# Patient Record
Sex: Female | Born: 1985 | Race: White | Hispanic: No | Marital: Single | Smoking: Never smoker
Health system: Southern US, Community
[De-identification: ages and names within clinical notes are randomized; demographics above are authoritative.]

## PROBLEM LIST (undated history)

## (undated) DIAGNOSIS — C8190 Hodgkin lymphoma, unspecified, unspecified site: Secondary | ICD-10-CM

## (undated) HISTORY — PX: PORTA CATH REMOVAL: CATH118286

## (undated) HISTORY — PX: BONE MARROW BIOPSY: SHX199

## (undated) HISTORY — DX: Hodgkin lymphoma, unspecified, unspecified site: C81.90

## (undated) HISTORY — PX: LYMPH NODE BIOPSY: SHX201

---

## 2016-12-31 ENCOUNTER — Other Ambulatory Visit (HOSPITAL_COMMUNITY)
Admission: RE | Admit: 2016-12-31 | Discharge: 2016-12-31 | Disposition: A | Discharge status: Home / Self Care | Payer: BLUE CROSS/BLUE SHIELD | Source: Ambulatory Visit | Attending: Certified Nurse Midwife | Admitting: Certified Nurse Midwife

## 2016-12-31 ENCOUNTER — Encounter: Payer: Self-pay | Admitting: Certified Nurse Midwife

## 2016-12-31 ENCOUNTER — Ambulatory Visit (INDEPENDENT_AMBULATORY_CARE_PROVIDER_SITE_OTHER): Payer: BLUE CROSS/BLUE SHIELD | Admitting: Certified Nurse Midwife

## 2016-12-31 VITALS — BP 104/60 | HR 68 | Resp 16 | Ht 66.25 in | Wt 173.0 lb

## 2016-12-31 DIAGNOSIS — Z01419 Encounter for gynecological examination (general) (routine) without abnormal findings: Secondary | ICD-10-CM

## 2016-12-31 DIAGNOSIS — Z8571 Personal history of Hodgkin lymphoma: Secondary | ICD-10-CM | POA: Diagnosis not present

## 2016-12-31 DIAGNOSIS — Z124 Encounter for screening for malignant neoplasm of cervix: Secondary | ICD-10-CM

## 2016-12-31 DIAGNOSIS — Z8249 Family history of ischemic heart disease and other diseases of the circulatory system: Secondary | ICD-10-CM | POA: Diagnosis not present

## 2016-12-31 DIAGNOSIS — Z0000 Encounter for general adult medical examination without abnormal findings: Secondary | ICD-10-CM

## 2016-12-31 NOTE — Patient Instructions (Signed)

## 2016-12-31 NOTE — Progress Notes (Signed)
31 y.o. G0P0000 Single  Caucasian Fe here to establish gyn and for annual exam. Periods normal, cramping relieved with Midol. Desires STD screening. Interested in other options for contraception. Mother had pulmonary embolism from OCP use at age 11 and father has Atrial fib, so not sure about OCP use. 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 Living in [place] to attend [employer], but home is in [place]. Busy with college and work. Would health screening also and would like to consider fertility information as she froze her eggs prior to chemotherapy for Hodgkins. No other health issues today.  Patient's last menstrual period was 12/19/2016 (exact date).          Sexually active: Yes.    The current method of family planning is condoms sometimes.    Exercising: No.  exercise Smoker:  no  Health Maintenance: Pap:  4-5 yrs ago History of Abnormal Pap: no MMG:  none Self Breast exams: yes Colonoscopy:  none BMD:   none TDaP:  UTD Shingles: no Pneumonia: no Hep C and HIV: neg per patient Labs: yes   reports that she has never smoked. She has never used smokeless tobacco. She reports that she drinks about 0.6 - 1.2 oz of alcohol per week . She reports that she does not use drugs.  Past Medical History:  Diagnosis Date  . Hodgkin lymphoma [facility])     Past Surgical History:  Procedure Laterality Date  . BONE MARROW BIOPSY    . LYMPH NODE BIOPSY     in neck  . PORTA CATH REMOVAL      No current outpatient prescriptions on file.   No current facility-administered medications for this visit.     Family History  Problem Relation Age of Onset  . Pulmonary embolism Mother   . Atrial fibrillation Father   . Thyroid cancer Maternal Aunt   . Lung cancer Paternal Uncle   . Liver cancer Paternal Uncle     ROS:  Pertinent items are noted in HPI.  Otherwise, a comprehensive ROS was negative.  Exam:   BP 104/60   Pulse 68   Resp 16   Ht 5' 6.25" (1.683 m)   Wt 173  lb (78.5 kg)   LMP 12/19/2016 (Exact Date)   BMI 27.71 kg/m  Height: 5' 6.25" (168.3 cm) Ht Readings from Last 3 Encounters:  12/31/16 5' 6.25" (1.683 m)    General appearance: alert, cooperative and appears stated age Head: Normocephalic, without obvious abnormality, atraumatic Neck: no adenopathy, supple, symmetrical, trachea midline and thyroid normal to inspection and palpation Lungs: clear to auscultation bilaterally Breasts: normal appearance, no masses or tenderness, No nipple retraction or dimpling, No nipple discharge or bleeding, No axillary or supraclavicular adenopathy, Taught monthly breast self examination Heart: regular rate and rhythm Abdomen: soft, non-tender; no masses,  no organomegaly Extremities: extremities normal, atraumatic, no cyanosis or edema Skin: Skin color, texture, turgor normal. No rashes or lesions Lymph nodes: Cervical, supraclavicular, and axillary nodes normal. No abnormal inguinal nodes palpated Neurologic: Grossly normal   Pelvic: External genitalia:  no lesions              Urethra:  normal appearing urethra with no masses, tenderness or lesions              Bartholin's and Skene's: normal                 Vagina: normal appearing vagina with normal color and discharge,  no lesions              Cervix: no bleeding following Pap, no cervical motion tenderness, no lesions and nulliparous appearance              Pap taken: Yes.   Bimanual Exam:  Uterus:  normal size, contour, position, consistency, mobility, non-tender and anteverted              Adnexa: normal adnexa and no mass, fullness, tenderness               Rectovaginal: Confirms               Anus:  normal sphincter tone, no lesions  Chaperone present: yes  A:  Well Woman with normal exam  Contraception condoms not always consistent  Contraception options desired  History of Hodgkins Lymphoma yearly follow up now  Fertility status information lab screening may desire  Screening  labs  P:   Reviewed health and wellness pertinent to exam  Discussed due to family history of PE with OCP increases risk with OCP use. Patient feels she had Factor V Lieden screening with Hodgkins and was negative. Given information regarding IUD and Nexplanon use and insurance information to check on benefits if she desires. Questions addressed.  Continue follow up with MD regarding her follow up  Patient will advise if she would like to go forward.  Labs: HIV,RPR, Hep C, Lipid panel, TSH, Vitamin D, vaginal screening  Pap smear: yes   counseled on breast self exam, mammography screening, STD prevention, HIV risk factors and prevention, family planning choices, adequate intake of calcium and vitamin D, diet and exercise  return annually or prn  An After Visit Summary was printed and given to the patient.

## 2017-01-01 ENCOUNTER — Encounter: Payer: Self-pay | Admitting: Certified Nurse Midwife

## 2017-01-01 LAB — LIPID PANEL
CHOL/HDL RATIO: 2.9 ratio (ref 0.0–4.4)
CHOLESTEROL TOTAL: 153 mg/dL (ref 100–199)
HDL: 52 mg/dL (ref >39)
LDL Calculated: 61 mg/dL (ref 0–99)
Triglycerides: 202 mg/dL — ABNORMAL HIGH (ref 0–149)
VLDL Cholesterol Cal: 40 mg/dL (ref 5–40)

## 2017-01-01 LAB — RPR: RPR Ser Ql: NONREACTIVE

## 2017-01-01 LAB — HEPATITIS C ANTIBODY: Hep C Virus Ab: <0.1 s/co ratio (ref 0.0–0.9)

## 2017-01-01 LAB — VITAMIN D 25 HYDROXY (VIT D DEFICIENCY, FRACTURES): VIT D 25 HYDROXY: 30.5 ng/mL (ref 30.0–100.0)

## 2017-01-01 LAB — HIV ANTIBODY (ROUTINE TESTING W REFLEX): HIV Screen 4th Generation wRfx: NONREACTIVE

## 2017-01-04 LAB — CYTOLOGY - PAP
BACTERIAL VAGINITIS: NEGATIVE
CANDIDA VAGINITIS: NEGATIVE
CHLAMYDIA, DNA PROBE: NEGATIVE
Diagnosis: NEGATIVE
HPV (WINDOPATH): NOT DETECTED
NEISSERIA GONORRHEA: NEGATIVE
Trichomonas: NEGATIVE

## 2017-06-16 ENCOUNTER — Emergency Department (HOSPITAL_COMMUNITY)
Admission: EM | Admit: 2017-06-16 | Discharge: 2017-06-16 | Disposition: A | Discharge status: Home / Self Care | Payer: BLUE CROSS/BLUE SHIELD | Attending: Emergency Medicine | Admitting: Emergency Medicine

## 2017-06-16 ENCOUNTER — Other Ambulatory Visit: Payer: Self-pay

## 2017-06-16 ENCOUNTER — Encounter (HOSPITAL_COMMUNITY): Payer: Self-pay | Admitting: *Deleted

## 2017-06-16 DIAGNOSIS — Y999 Unspecified external cause status: Secondary | ICD-10-CM | POA: Diagnosis not present

## 2017-06-16 DIAGNOSIS — X509XXA Other and unspecified overexertion or strenuous movements or postures, initial encounter: Secondary | ICD-10-CM | POA: Insufficient documentation

## 2017-06-16 DIAGNOSIS — S86819A Strain of other muscle(s) and tendon(s) at lower leg level, unspecified leg, initial encounter: Secondary | ICD-10-CM

## 2017-06-16 DIAGNOSIS — S8992XA Unspecified injury of left lower leg, initial encounter: Secondary | ICD-10-CM | POA: Diagnosis present

## 2017-06-16 DIAGNOSIS — Y92038 Other place in apartment as the place of occurrence of the external cause: Secondary | ICD-10-CM | POA: Insufficient documentation

## 2017-06-16 DIAGNOSIS — C8190 Hodgkin lymphoma, unspecified, unspecified site: Secondary | ICD-10-CM | POA: Diagnosis not present

## 2017-06-16 DIAGNOSIS — Y93K1 Activity, walking an animal: Secondary | ICD-10-CM | POA: Insufficient documentation

## 2017-06-16 DIAGNOSIS — S86812A Strain of other muscle(s) and tendon(s) at lower leg level, left leg, initial encounter: Secondary | ICD-10-CM | POA: Insufficient documentation

## 2017-06-16 NOTE — Discharge Instructions (Signed)
Rest - please stay off left leg as much as possible for the next several days Ice - ice for 20 minutes at a time, several times a day Use knee immobilizer for the next several days while using crutches Elevate - elevate leg above level of heart Ibuprofen or Naproxen- take with food. Take as directed. Follow up with orthopedics if symptoms are not improving in the next several weeks

## 2017-06-16 NOTE — ED Triage Notes (Signed)
Pt here stating she stepped down from stair and she heard her calf pop.  Since then she has been unable to bear weight d/t pain (states feels like she has a "permanent charlie horse").  Able to move foot and knee.

## 2017-06-16 NOTE — ED Provider Notes (Addendum)
[facility]GENCY DEPARTMENT Provider Note   CSN: [id number] Arrival date & time: 06/16/17  1629     History   Chief Complaint Chief Complaint  Patient presents with  . Leg Pain    HPI Alisha Schroeder is a 32 y.o. female who presents with left calf pain.  She states that she was walking her dog and was going down her apartment steps and when she stepped down she felt a pop in her calf and immediate onset of pain.  The incident occurred about 2 hours ago. The pain is over the medial side of the calf.  She has been unable to bear weight due to pain.  She denies knee or ankle pain.  She denies numbness or tingling.  She has never had anything like this before.  She has had issues with charley horses in the past but this is gotten better.  HPI  Past Medical History:  Diagnosis Date  . Hodgkin lymphoma ([place])     There are no active problems to display for this patient.   Past Surgical History:  Procedure Laterality Date  . BONE MARROW BIOPSY    . LYMPH NODE BIOPSY     in neck  . PORTA CATH REMOVAL      OB History    Gravida Para Term Preterm AB Living   0 0 0 0 0 0   SAB TAB Ectopic Multiple Live Births   0 0 0 0 0       Home Medications    Prior to Admission medications   Not on File    Family History Family History  Problem Relation Age of Onset  . Pulmonary embolism Mother   . Atrial fibrillation Father   . Thyroid cancer Maternal Aunt   . Lung cancer Paternal Uncle   . Liver cancer Paternal Uncle     Social History Social History   Tobacco Use  . Smoking status: Never Smoker  . Smokeless tobacco: Never Used  Substance Use Topics  . Alcohol use: Yes    Alcohol/week: 0.6 - 1.2 oz    Types: 1 - 2 Standard drinks or equivalent per week  . Drug use: No     Allergies   Patient has no known allergies.   Review of Systems Review of Systems  Musculoskeletal: Positive for gait problem and myalgias. Negative for arthralgias.    Neurological: Negative for weakness and numbness.     Physical Exam Updated Vital Signs BP 131/82 (BP Location: Right Arm)   Pulse (!) 106   Temp 98.4 F (36.9 C) (Oral)   Resp 16   Ht 5' 6"  (1.676 m)   Wt 78 kg (172 lb)   LMP 06/04/2017   SpO2 100%   BMI 27.76 kg/m   Physical Exam  Constitutional: She is oriented to person, place, and time. She appears well-developed and well-nourished. No distress.  HENT:  Head: Normocephalic and atraumatic.  Eyes: Conjunctivae are normal. Pupils are equal, round, and reactive to light. Right eye exhibits no discharge. Left eye exhibits no discharge. No scleral icterus.  Neck: Normal range of motion.  Cardiovascular: Normal rate.  Pulmonary/Chest: Effort normal. No respiratory distress.  Abdominal: She exhibits no distension.  Musculoskeletal:  Left knee: No obvious swelling, deformity, or warmth. No tenderness. FROM.  Left calf: No obvious swelling, deformity, warmth, or skin changes. Tenderness over medial aspect of calf.   Left ankle: No obvious swelling, deformity, or warmth. Achilles is intact. N/V intact.  Neurological: She is alert and oriented to person, place, and time.  Skin: Skin is warm and dry.  Psychiatric: She has a normal mood and affect. Her behavior is normal.  Nursing note and vitals reviewed.    ED Treatments / Results  Labs (all labs ordered are listed, but only abnormal results are displayed) Labs Reviewed - No data to display  EKG  EKG Interpretation None       Radiology No results found.  Procedures Procedures (including critical care time)  Medications Ordered in ED Medications - No data to display   Initial Impression / Assessment and Plan / ED Course  I have reviewed the triage vital signs and the nursing notes.  Pertinent labs & imaging results that were available during my care of the patient were reviewed by me and considered in my medical decision making (see chart for  details).  32 year old female with symptoms consistent with a calf muscle strain.  Exam is remarkable for tenderness over the medial calf without knee or ankle tenderness.  Will defer imaging since injury is clearly soft tissue related. She is unable to bear weight due to pain.  We will give her a knee immobilizer and crutches.  Rice protocol discussed.  Advised taking NSAIDs and Tylenol for pain.  Recommended orthopedic follow-up if symptoms are not improving in several weeks.  Final Clinical Impressions(s) / ED Diagnoses   Final diagnoses:  Strain of calf muscle, initial encounter    ED Discharge Orders    None       Recardo Evangelist, PA-C 06/16/17 1809    Recardo Evangelist, PA-C 06/16/17 1810    Sherwood Gambler, MD 06/17/17 [phone]

## 2018-01-07 ENCOUNTER — Ambulatory Visit: Payer: BLUE CROSS/BLUE SHIELD | Admitting: Certified Nurse Midwife

## 2018-06-01 ENCOUNTER — Ambulatory Visit: Payer: Self-pay | Admitting: Certified Nurse Midwife

## 2018-06-08 ENCOUNTER — Other Ambulatory Visit: Payer: Self-pay

## 2018-06-08 ENCOUNTER — Other Ambulatory Visit (HOSPITAL_COMMUNITY)
Admission: RE | Admit: 2018-06-08 | Discharge: 2018-06-08 | Disposition: A | Discharge status: Home / Self Care | Payer: 59 | Source: Ambulatory Visit | Attending: Certified Nurse Midwife | Admitting: Certified Nurse Midwife

## 2018-06-08 ENCOUNTER — Ambulatory Visit (INDEPENDENT_AMBULATORY_CARE_PROVIDER_SITE_OTHER): Payer: 59 | Admitting: Certified Nurse Midwife

## 2018-06-08 ENCOUNTER — Encounter: Payer: Self-pay | Admitting: Certified Nurse Midwife

## 2018-06-08 VITALS — BP 120/80 | HR 76 | Resp 16 | Ht 66.75 in | Wt 176.0 lb

## 2018-06-08 DIAGNOSIS — Z113 Encounter for screening for infections with a predominantly sexual mode of transmission: Secondary | ICD-10-CM | POA: Diagnosis not present

## 2018-06-08 DIAGNOSIS — N938 Other specified abnormal uterine and vaginal bleeding: Secondary | ICD-10-CM | POA: Diagnosis not present

## 2018-06-08 DIAGNOSIS — Z124 Encounter for screening for malignant neoplasm of cervix: Secondary | ICD-10-CM | POA: Insufficient documentation

## 2018-06-08 DIAGNOSIS — Z8571 Personal history of Hodgkin lymphoma: Secondary | ICD-10-CM

## 2018-06-08 DIAGNOSIS — Z01419 Encounter for gynecological examination (general) (routine) without abnormal findings: Secondary | ICD-10-CM

## 2018-06-08 NOTE — Patient Instructions (Signed)
EXERCISE AND DIET:  We recommended that you start or continue a regular exercise program for good health. Regular exercise means any activity that makes your heart beat faster and makes you sweat.  We recommend exercising at least 30 minutes per day at least 3 days a week, preferably 4 or 5.  We also recommend a diet low in fat and sugar.  Inactivity, poor dietary choices and obesity can cause diabetes, heart attack, stroke, and kidney damage, among others.    ALCOHOL AND SMOKING:  Women should limit their alcohol intake to no more than 7 drinks/beers/glasses of wine (combined, not each!) per week. Moderation of alcohol intake to this level decreases your risk of breast cancer and liver damage. And of course, no recreational drugs are part of a healthy lifestyle.  And absolutely no smoking or even second hand smoke. Most people know smoking can cause heart and lung diseases, but did you know it also contributes to weakening of your bones? Aging of your skin?  Yellowing of your teeth and nails?  CALCIUM AND VITAMIN D:  Adequate intake of calcium and Vitamin D are recommended.  The recommendations for exact amounts of these supplements seem to change often, but generally speaking 600 mg of calcium (either carbonate or citrate) and 800 units of Vitamin D per day seems prudent. Certain women may benefit from higher intake of Vitamin D.  If you are among these women, your doctor will have told you during your visit.    PAP SMEARS:  Pap smears, to check for cervical cancer or precancers,  have traditionally been done yearly, although recent scientific advances have shown that most women can have pap smears less often.  However, every woman still should have a physical exam from her gynecologist every year. It will include a breast check, inspection of the vulva and vagina to check for abnormal growths or skin changes, a visual exam of the cervix, and then an exam to evaluate the size and shape of the uterus and  ovaries.  And after 33 years of age, a rectal exam is indicated to check for rectal cancers. We will also provide age appropriate advice regarding health maintenance, like when you should have certain vaccines, screening for sexually transmitted diseases, bone density testing, colonoscopy, mammograms, etc.   MAMMOGRAMS:  All women over 40 years old should have a yearly mammogram. Many facilities now offer a "3D" mammogram, which may cost around $50 extra out of pocket. If possible,  we recommend you accept the option to have the 3D mammogram performed.  It both reduces the number of women who will be called back for extra views which then turn out to be normal, and it is better than the routine mammogram at detecting truly abnormal areas.    COLONOSCOPY:  Colonoscopy to screen for colon cancer is recommended for all women at age 50.  We know, you hate the idea of the prep.  We agree, BUT, having colon cancer and not knowing it is worse!!  Colon cancer so often starts as a polyp that can be seen and removed at colonscopy, which can quite literally save your life!  And if your first colonoscopy is normal and you have no family history of colon cancer, most women don't have to have it again for 10 years.  Once every ten years, you can do something that may end up saving your life, right?  We will be happy to help you get it scheduled when you are ready.    Be sure to check your insurance coverage so you understand how much it will cost.  It may be covered as a preventative service at no cost, but you should check your particular policy.      Abnormal Uterine Bleeding Abnormal uterine bleeding means bleeding more than usual from your uterus. It can include:  Bleeding between periods.  Bleeding after sex.  Bleeding that is heavier than normal.  Periods that last longer than usual.  Bleeding after you have stopped having your period (menopause). There are many problems that may cause this. You should see  a doctor for any kind of bleeding that is not normal. Treatment depends on the cause of the bleeding. Follow these instructions at home:  Watch your condition for any changes.  Do not use tampons, douche, or have sex, if your doctor tells you not to.  Change your pads often.  Get regular well-woman exams. Make sure they include a pelvic exam and cervical cancer screening.  Keep all follow-up visits as told by your doctor. This is important. Contact a doctor if:  The bleeding lasts more than one week.  You feel dizzy at times.  You feel like you are going to throw up (nauseous).  You throw up. Get help right away if:  You pass out.  You have to change pads every hour.  You have belly (abdominal) pain.  You have a fever.  You get sweaty.  You get weak.  You passing large blood clots from your vagina. Summary  Abnormal uterine bleeding means bleeding more than usual from your uterus.  There are many problems that may cause this. You should see a doctor for any kind of bleeding that is not normal.  Treatment depends on the cause of the bleeding. This information is not intended to replace advice given to you by your health care provider. Make sure you discuss any questions you have with your health care provider. Document Released: 02/01/2009 Document Revised: 03/31/2016 Document Reviewed: 03/31/2016 Elsevier Interactive Patient Education  2019 [employer].

## 2018-06-08 NOTE — Progress Notes (Signed)
33 y.o. G0P0000 Single  Caucasian Fe here for annual exam. Periods normal up to this month with period two weeks ago approximate which had some clotting and now bleeding again now for the past 3 days with normal bleeding. She has had normal periods except when she had chemotherapy with Hodgkin lymphoma. Continues follow up once year with labs and exam,  all normal. Contraception condoms sometimes. No partner change, but STD screening  Desired due to period change. 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 Sees Urgent care if needed. No other health concerns today. Leaving [place] vacation soon!  Patient's last menstrual period was 06/06/2018.          Sexually active: Yes.    The current method of family planning is none.    Exercising: No.  The patient does not participate in regular exercise at present. Smoker:  no  Review of Systems  Constitutional: Negative.   HENT: Negative.   Eyes: Negative.   Respiratory: Negative.   Cardiovascular: Negative.   Gastrointestinal: Negative.   Genitourinary:       Menstrual cycle changes Unscheduled bleeding or spotting   Musculoskeletal: Negative.   Skin: Negative.   Neurological: Negative.   Endo/Heme/Allergies: Negative.   Psychiatric/Behavioral: Negative.     Health Maintenance: Pap:  12-31-16 neg HPV HR neg History of Abnormal Pap: no MMG:  none Self Breast exams: yes Colonoscopy:  none BMD:   none TDaP:  UTD Shingles: no Pneumonia: no Hep C and HIV: both neg 2018 Labs: discuss today if needed   reports that she has never smoked. She has never used smokeless tobacco. She reports current alcohol use of about 1.0 - 2.0 standard drinks of alcohol per week. She reports that she does not use drugs.  Past Medical History:  Diagnosis Date  . Hodgkin lymphoma [facility])     Past Surgical History:  Procedure Laterality Date  . BONE MARROW BIOPSY    . LYMPH NODE BIOPSY     in neck  . PORTA CATH REMOVAL      No current  outpatient medications on file.   No current facility-administered medications for this visit.     Family History  Problem Relation Age of Onset  . Pulmonary embolism Mother   . Atrial fibrillation Father   . Thyroid cancer Maternal Aunt   . Lung cancer Paternal Uncle   . Liver cancer Paternal Uncle     ROS:  Pertinent items are noted in HPI.  Otherwise, a comprehensive ROS was negative.  Exam:   BP 120/80 (BP Location: Right Arm, Patient Position: Sitting, Cuff Size: Normal)   Pulse 76   Resp 16   Ht 5' 6.75" (1.695 m)   Wt 176 lb (79.8 kg)   LMP 06/06/2018   BMI 27.77 kg/m  Height: 5' 6.75" (169.5 cm) Ht Readings from Last 3 Encounters:  06/08/18 5' 6.75" (1.695 m)  06/16/17 5' 6"  (1.676 m)  12/31/16 5' 6.25" (1.683 m)    POCT UPT negative General appearance: alert, cooperative and appears stated age Head: Normocephalic, without obvious abnormality, atraumatic Neck: no adenopathy, supple, symmetrical, trachea midline and thyroid normal to inspection and palpation Lungs: clear to auscultation bilaterally Breasts: normal appearance, no masses or tenderness, No nipple retraction or dimpling, No nipple discharge or bleeding, No axillary or supraclavicular adenopathy Heart: regular rate and rhythm Abdomen: soft, non-tender; no masses,  no organomegaly Extremities: extremities normal, atraumatic, no cyanosis or edema Skin: Skin color, texture, turgor normal.  No rashes or lesions Lymph nodes: Cervical, supraclavicular, and axillary nodes normal. No abnormal inguinal nodes palpated Neurologic: Grossly normal   Pelvic: External genitalia:  no lesions              Urethra:  normal appearing urethra with no masses, tenderness or lesions              Bartholin's and Skene's: normal                 Vagina: normal appearing vagina with normal color and discharge, no lesions              Cervix: no cervical motion tenderness, no lesions, nulliparous appearance and scant blood  noted from cervix              Pap taken: Yes.   Bimanual Exam:  Uterus:  normal size, contour, position, consistency, mobility, non-tender and anteverted              Adnexa: normal adnexa and no mass, fullness, tenderness               Rectovaginal: Confirms               Anus:  normal sphincter tone, no lesions  Chaperone present: yes  A:  Well Woman with normal exam  Contraception condoms sometimes  Menstrual cycle change in past 2 months  History of Hodgkin Lymphoma still in remission with MD follow up  STD screening  P:   Reviewed health and wellness pertinent to exam  Discussed importance of contraception if not trying for pregnancy, for STD prevention. Patient aware. Declines contraception information.  Discussed keeping menstrual calendar and notifying if period is abnormal in the next month. Discussed thyroid, weight change, stress or medication can alter menstrual cycle.  Lab: TSH, STD panel, Gc/chlamydia, Affirm, Hep C  Pap smear: yes   counseled on breast self exam, STD prevention, HIV risk factors and prevention, feminine hygiene, family planning choices, adequate intake of calcium and vitamin D, diet and exercise  return annually or prn  An After Visit Summary was printed and given to the patient.

## 2018-06-09 ENCOUNTER — Other Ambulatory Visit: Payer: Self-pay

## 2018-06-09 LAB — VAGINITIS/VAGINOSIS, DNA PROBE
CANDIDA SPECIES: NEGATIVE
Gardnerella vaginalis: POSITIVE — AB
TRICHOMONAS VAG: NEGATIVE

## 2018-06-09 LAB — CYTOLOGY - PAP
Diagnosis: NEGATIVE
HPV: NOT DETECTED

## 2018-06-09 LAB — HEP, RPR, HIV PANEL
HIV SCREEN 4TH GENERATION: NONREACTIVE
Hepatitis B Surface Ag: NEGATIVE
RPR Ser Ql: NONREACTIVE

## 2018-06-09 LAB — TSH: TSH: 3.82 u[IU]/mL (ref 0.450–4.500)

## 2018-06-09 LAB — HEPATITIS C ANTIBODY

## 2018-06-09 MED ORDER — TINIDAZOLE 500 MG PO TABS: 500.0000 mg | ORAL_TABLET | Freq: Two times a day (BID) | ORAL | 0 refills | Status: DC

## 2018-06-09 NOTE — Telephone Encounter (Signed)
-----   Message from Regina Eck, CNM sent at 06/09/2018 10:26 AM EST ----- Notify patient  Her vaginal screening was positive for BV only, yeast and trichomonas negative Will need Rx Tindamax 500 mg bid x 5 days  Alcohol precautions, Needs to have no concern for pregnancy, no contraception will need to be on period TSH is normal, All other labs pending

## 2018-06-09 NOTE — Telephone Encounter (Signed)
Patient notified of result & rx sent to pharmacy. Patient aware to wait until menstrual cycle to start medication.

## 2018-06-09 NOTE — Telephone Encounter (Signed)
Patient returning call.

## 2018-06-09 NOTE — Telephone Encounter (Signed)
Left message for call back.

## 2018-06-10 LAB — GC/CHLAMYDIA PROBE AMP
Chlamydia trachomatis, NAA: NEGATIVE
Neisseria gonorrhoeae by PCR: NEGATIVE

## 2018-07-29 ENCOUNTER — Encounter: Payer: Self-pay | Admitting: Obstetrics and Gynecology

## 2018-07-29 ENCOUNTER — Ambulatory Visit (INDEPENDENT_AMBULATORY_CARE_PROVIDER_SITE_OTHER): Payer: 59 | Admitting: Obstetrics and Gynecology

## 2018-07-29 ENCOUNTER — Telehealth: Payer: Self-pay | Admitting: Certified Nurse Midwife

## 2018-07-29 ENCOUNTER — Other Ambulatory Visit: Payer: Self-pay

## 2018-07-29 VITALS — BP 110/70 | HR 68 | Temp 97.6°F | Resp 16 | Wt 181.0 lb

## 2018-07-29 DIAGNOSIS — N760 Acute vaginitis: Secondary | ICD-10-CM

## 2018-07-29 DIAGNOSIS — N939 Abnormal uterine and vaginal bleeding, unspecified: Secondary | ICD-10-CM

## 2018-07-29 LAB — POCT URINE PREGNANCY: Preg Test, Ur: NEGATIVE

## 2018-07-29 NOTE — Telephone Encounter (Signed)
Patient calling with symptoms of BV. Would like refill on medication to treat. Aware that she may need to be seen in office, but would prefer not to.

## 2018-07-29 NOTE — Progress Notes (Signed)
GYNECOLOGY  VISIT   HPI: 33 y.o.   Single  Caucasian  female   G0P0000 with Patient's last menstrual period was 07/14/2018 (exact date).   here for  Vaginitis. Having vaginal odor and discharge.   States this is her seventh time since she was 33 yo.   Also had some bleeding last hs, not related to intercourse.  Prior LMP was 06/19/18.  Her menses are usually a month apart.  (She did have 2 menses per month for Jan and Feb 2020.)  Tx for BV with Tindamax after visit on 06/08/18.  Was able to take the prescription.   New partner starting 06/05/18.   Denies pain with intercourse.  Not preventing pregnancy.  Not using condoms.  UPT:    GYNECOLOGIC HISTORY: Patient's last menstrual period was 07/14/2018 (exact date). Contraception:  none Menopausal hormone therapy:  none Last mammogram:  none Last pap smear:   06-08-2018 neg HPV HR neg        OB History    Gravida  0   Para  0   Term  0   Preterm  0   AB  0   Living  0     SAB  0   TAB  0   Ectopic  0   Multiple  0   Live Births  0              There are no active problems to display for this patient.   Past Medical History:  Diagnosis Date  . Hodgkin lymphoma [facility])     Past Surgical History:  Procedure Laterality Date  . BONE MARROW BIOPSY    . LYMPH NODE BIOPSY     in neck  . PORTA CATH REMOVAL      No current outpatient medications on file.   No current facility-administered medications for this visit.      ALLERGIES: Patient has no known allergies.  Family History  Problem Relation Age of Onset  . Pulmonary embolism Mother   . Atrial fibrillation Father   . Thyroid cancer Maternal Aunt   . Lung cancer Paternal Uncle   . Liver cancer Paternal Uncle     Social History   Socioeconomic History  . Marital status: Single    Spouse name: Not on file  . Number of children: Not on file  . Years of education: Not on file  . Highest education level: Not on file  Occupational  History  . Not on file  Social Needs  . Financial resource strain: Not on file  . Food insecurity:    Worry: Not on file    Inability: Not on file  . Transportation needs:    Medical: Not on file    Non-medical: Not on file  Tobacco Use  . Smoking status: Never Smoker  . Smokeless tobacco: Never Used  Substance and Sexual Activity  . Alcohol use: Yes    Alcohol/week: 1.0 - 2.0 standard drinks    Types: 1 - 2 Standard drinks or equivalent per week  . Drug use: No  . Sexual activity: Yes    Partners: Male    Birth control/protection: None  Lifestyle  . Physical activity:    Days per week: Not on file    Minutes per session: Not on file  . Stress: Not on file  Relationships  . Social connections:    Talks on phone: Not on file    Gets together: Not on file  Attends religious service: Not on file    Active member of club or organization: Not on file    Attends meetings of clubs or organizations: Not on file    Relationship status: Not on file  . Intimate partner violence:    Fear of current or ex partner: Not on file    Emotionally abused: Not on file    Physically abused: Not on file    Forced sexual activity: Not on file  Other Topics Concern  . Not on file  Social History Narrative  . Not on file    Review of Systems  Constitutional: Negative.   Eyes: Negative.   Respiratory: Negative.   Cardiovascular: Negative.   Gastrointestinal: Negative.   Genitourinary:       Vaginal odor & discharge  Musculoskeletal: Negative.   Skin: Negative.   Allergic/Immunologic: Negative.   Neurological: Negative.   Hematological: Negative.   Psychiatric/Behavioral: Negative.     PHYSICAL EXAMINATION:    BP 110/70   Pulse 68   Temp 97.6 F (36.4 C) (Oral)   Resp 16   Wt 181 lb (82.1 kg)   LMP 07/14/2018 (Exact Date)   BMI 28.56 kg/m     General appearance: alert, cooperative and appears stated age   Pelvic: External genitalia:  no lesions              Urethra:   normal appearing urethra with no masses, tenderness or lesions              Bartholins and Skenes: normal                 Vagina: normal appearing vagina with normal color and discharge, no lesions              Cervix: no lesions                Bimanual Exam:  Uterus:  normal size, contour, position, consistency, mobility, non-tender              Adnexa: no mass, fullness, tenderness            Chaperone was present for exam.  ASSESSMENT  Vaginitis.   Recent BV.  Abnormal uterine bleeding.  PLAN  Affirm. Tx to follow.  UPT - negative. I discussed PNV if trying for pregnancy.  We discussed Covid 19 and pregnancy. Fu prn.    An After Visit Summary was printed and given to the patient.  _15____ minutes face to face time of which over 50% was spent in counseling.

## 2018-07-29 NOTE — Telephone Encounter (Signed)
Return call to patient.  Last treated with Tindamax for bacterial vaginosis on 06/08/2018. Symptoms relieved until 3 days ago, started to have vaginal discharge with odor. Odor increasing per patient.  No vaginal bleeding, no STD exposures, no pelvic pain.  She states she really would like to be treated today.   Advised office visit indicated and explained our measures for keeping our patients safe during Covid-19 outbreak.  Pt agreeable to office visit.  Encounter closed.

## 2018-07-29 NOTE — Patient Instructions (Signed)
Vaginitis  Vaginitis is a condition in which the vaginal tissue swells and becomes red (inflamed). This condition is most often caused by a change in the normal balance of bacteria and yeast that live in the vagina. This change causes an overgrowth of certain bacteria or yeast, which causes the inflammation. There are different types of vaginitis, but the most common types are:   Bacterial vaginosis.   Yeast infection (candidiasis).   Trichomoniasis vaginitis. This is a sexually transmitted disease (STD).   Viral vaginitis.   Atrophic vaginitis.   Allergic vaginitis.  What are the causes?  The cause of this condition depends on the type of vaginitis. It can be caused by:   Bacteria (bacterial vaginosis).   Yeast, which is a fungus (yeast infection).   A parasite (trichomoniasis vaginitis).   A virus (viral vaginitis).   Low hormone levels (atrophic vaginitis). Low hormone levels can occur during pregnancy, breastfeeding, or after menopause.   Irritants, such as bubble baths, scented tampons, and feminine sprays (allergic vaginitis).  Other factors can change the normal balance of the yeast and bacteria that live in the vagina. These include:   Antibiotic medicines.   Poor hygiene.   Diaphragms, vaginal sponges, spermicides, birth control pills, and intrauterine devices (IUD).   Sex.   Infection.   Uncontrolled diabetes.   A weakened defense (immune) system.  What increases the risk?  This condition is more likely to develop in women who:   Smoke.   Use vaginal douches, scented tampons, or scented sanitary pads.   Wear tight-fitting pants.   Wear thong underwear.   Use oral birth control pills or an IUD.   Have sex without a condom.   Have multiple sex partners.   Have an STD.   Frequently use the spermicide nonoxynol-9.   Eat lots of foods high in sugar.   Have uncontrolled diabetes.   Have low estrogen levels.   Have a weakened immune system from an immune disorder or medical  treatment.   Are pregnant or breastfeeding.  What are the signs or symptoms?  Symptoms vary depending on the cause of the vaginitis. Common symptoms include:   Abnormal vaginal discharge.  ? The discharge is white, gray, or yellow with bacterial vaginosis.  ? The discharge is thick, white, and cheesy with a yeast infection.  ? The discharge is frothy and yellow or greenish with trichomoniasis.   A bad vaginal smell. The smell is fishy with bacterial vaginosis.   Vaginal itching, pain, or swelling.   Sex that is painful.   Pain or burning when urinating.  Sometimes there are no symptoms.  How is this diagnosed?  This condition is diagnosed based on your symptoms and medical history. A physical exam, including a pelvic exam, will also be done. You may also have other tests, including:   Tests to determine the pH level (acidity or alkalinity) of your vagina.   A whiff test, to assess the odor that results when a sample of your vaginal discharge is mixed with a potassium hydroxide solution.   Tests of vaginal fluid. A sample will be examined under a microscope.  How is this treated?  Treatment varies depending on the type of vaginitis you have. Your treatment may include:   Antibiotic creams or pills to treat bacterial vaginosis and trichomoniasis.   Antifungal medicines, such as vaginal creams or suppositories, to treat a yeast infection.   Medicine to ease discomfort if you have viral vaginitis. Your sexual partner   should also be treated.   Estrogen delivered in a cream, pill, suppository, or vaginal ring to treat atrophic vaginitis. If vaginal dryness occurs, lubricants and moisturizing creams may help. You may need to avoid scented soaps, sprays, or douches.   Stopping use of a product that is causing allergic vaginitis. Then using a vaginal cream to treat the symptoms.  Follow these instructions at home:  Lifestyle   Keep your genital area clean and dry. Avoid soap, and only rinse the area with  water.   Do not douche or use tampons until your health care provider says it is okay to do so. Use sanitary pads, if needed.   Do not have sex until your health care provider approves. When you can return to sex, practice safe sex and use condoms.   Wipe from front to back. This avoids the spread of bacteria from the rectum to the vagina.  General instructions   Take over-the-counter and prescription medicines only as told by your health care provider.   If you were prescribed an antibiotic medicine, take or use it as told by your health care provider. Do not stop taking or using the antibiotic even if you start to feel better.   Keep all follow-up visits as told by your health care provider. This is important.  How is this prevented?   Use mild, non-scented products. Do not use things that can irritate the vagina, such as fabric softeners. Avoid the following products if they are scented:  ? Feminine sprays.  ? Detergents.  ? Tampons.  ? Feminine hygiene products.  ? Soaps or bubble baths.   Let air reach your genital area.  ? Wear cotton underwear to reduce moisture buildup.  ? Avoid wearing underwear while you sleep.  ? Avoid wearing tight pants and underwear or nylons without a cotton panel.  ? Avoid wearing thong underwear.   Take off any wet clothing, such as bathing suits, as soon as possible.   Practice safe sex and use condoms.  Contact a health care provider if:   You have abdominal pain.   You have a fever.   You have symptoms that last for more than 2-3 days.  Get help right away if:   You have a fever and your symptoms suddenly get worse.  Summary   Vaginitis is a condition in which the vaginal tissue becomes inflamed.This condition is most often caused by a change in the normal balance of bacteria and yeast that live in the vagina.   Treatment varies depending on the type of vaginitis you have.   Do not douche, use tampons , or have sex until your health care provider approves. When  you can return to sex, practice safe sex and use condoms.  This information is not intended to replace advice given to you by your health care provider. Make sure you discuss any questions you have with your health care provider.  Document Released: 02/01/2007 Document Revised: 05/12/2016 Document Reviewed: 05/12/2016  Elsevier Interactive Patient Education  2019 [employer]

## 2018-07-30 LAB — VAGINITIS/VAGINOSIS, DNA PROBE
Candida Species: NEGATIVE
Gardnerella vaginalis: POSITIVE — AB
Trichomonas vaginosis: NEGATIVE

## 2018-08-01 ENCOUNTER — Telehealth: Payer: Self-pay

## 2018-08-01 NOTE — Telephone Encounter (Signed)
-----   Message from Nunzio Cobbs, MD sent at 07/31/2018  8:13 AM EDT ----- Please inform of Affirm result showing bacterial vaginosis. She may treat with Flagyl 500 mg po bid for 7 days or Metrogel pv at hs for 5 nights.  Please send Rx to pharmacy of choice. ETOH precautions.

## 2018-08-01 NOTE — Telephone Encounter (Signed)
Left message to call Kaitlyn at [phone]. 

## 2018-08-02 MED ORDER — METRONIDAZOLE 500 MG PO TABS: 500.0000 mg | ORAL_TABLET | Freq: Two times a day (BID) | ORAL | 0 refills | Status: DC

## 2018-08-02 NOTE — Telephone Encounter (Signed)
Spoke with patient. Advised of results as seen below from [facility]. Patient verbalizes understanding. Rx for Flagyl 500 mg po BID x 7 days #14 0RF sent to pharmacy on file. Avoid alcohol during treatment and 24 hours after completing medication. Don't mix with alcohol if mixed can cause severe nausea, vomiting and abdominal cramping.Patient verbalizes understanding. Encounter closed.

## 2019-07-12 ENCOUNTER — Encounter: Payer: Self-pay | Admitting: Certified Nurse Midwife

## 2019-08-08 ENCOUNTER — Telehealth: Payer: Self-pay | Admitting: Obstetrics and Gynecology

## 2019-08-08 NOTE — Telephone Encounter (Signed)
Patient is having irregular cycles with spotting.

## 2019-08-08 NOTE — Telephone Encounter (Signed)
Patient returned a call to Jill.   

## 2019-08-08 NOTE — Telephone Encounter (Signed)
Spoke with patient. Patient reports irregular menses for the past 2 months. Cycles are lasting 9-10 days, flow is light, BTB between cycles. Denies any other symptoms. Patient is SA, no contraceptive. Has not taken UPT. LMP 07/24/19. Patient reports Hx of irregular cycles.   Recommended OV for further evaluation, patient agreeable. OV scheduled for 08/11/19 at 8am with Dr. Quincy Simmonds. Instructed patient to take UPT to r/o pregnancy. Last AEX 06/08/18 with Melvia Heaps, CNM. Patient will schedule next AEX while in office. Patient verbalizes understanding and is agreeable.   Routing to provider for final review. Patient is agreeable to disposition. Will close encounter.

## 2019-08-08 NOTE — Telephone Encounter (Signed)
Left message to call Sharee Pimple, RN at [facility].   Last AEX 06/08/18 with Melvia Heaps, CNM

## 2019-08-11 ENCOUNTER — Ambulatory Visit (INDEPENDENT_AMBULATORY_CARE_PROVIDER_SITE_OTHER): Payer: 59 | Admitting: Obstetrics and Gynecology

## 2019-08-11 ENCOUNTER — Other Ambulatory Visit (HOSPITAL_COMMUNITY)
Admission: RE | Admit: 2019-08-11 | Discharge: 2019-08-11 | Disposition: A | Discharge status: Home / Self Care | Payer: Self-pay | Source: Ambulatory Visit | Attending: Obstetrics and Gynecology | Admitting: Obstetrics and Gynecology

## 2019-08-11 ENCOUNTER — Other Ambulatory Visit: Payer: Self-pay

## 2019-08-11 ENCOUNTER — Encounter: Payer: Self-pay | Admitting: Obstetrics and Gynecology

## 2019-08-11 VITALS — BP 132/76 | HR 92 | Temp 97.5°F | Resp 14 | Ht 66.0 in | Wt 187.1 lb

## 2019-08-11 DIAGNOSIS — N926 Irregular menstruation, unspecified: Secondary | ICD-10-CM | POA: Diagnosis not present

## 2019-08-11 DIAGNOSIS — Z113 Encounter for screening for infections with a predominantly sexual mode of transmission: Secondary | ICD-10-CM | POA: Diagnosis not present

## 2019-08-11 LAB — POCT URINE PREGNANCY: Preg Test, Ur: NEGATIVE

## 2019-08-11 MED ORDER — MEDROXYPROGESTERONE ACETATE 10 MG PO TABS: 10.0000 mg | ORAL_TABLET | Freq: Every day | ORAL | 0 refills | Status: AC

## 2019-08-11 NOTE — Progress Notes (Signed)
GYNECOLOGY  VISIT   HPI: 34 y.o.   Single  Caucasian  female   G0P0000 with Patient's last menstrual period was 07/24/2019.   here for   Irregular menses.  States her menses are lasting 9 days.  Menses started 07/24/19 and then started bleeding again lightly for the last 4 - 5 days.  Previous periods: January normal 6 day menses.  February started bleeding and lasted 10 days. March 10, started bleeding again and lasted 11 days.  Dull cramping the first day.   No change in partner.  May consider future pregnancy.  Not using prevention.   Patient states she was tested for an underlying blood dyscrasia and she tested negative.   UPT negative  GYNECOLOGIC HISTORY: Patient's last menstrual period was 07/24/2019. Contraception:  none Menopausal hormone therapy:  none Last mammogram:  n/a Last pap smear:   06-08-2018 negative, HR HPV negative         OB History    Gravida  0   Para  0   Term  0   Preterm  0   AB  0   Living  0     SAB  0   TAB  0   Ectopic  0   Multiple  0   Live Births  0              There are no problems to display for this patient.   Past Medical History:  Diagnosis Date  . Hodgkin lymphoma [facility])     Past Surgical History:  Procedure Laterality Date  . BONE MARROW BIOPSY    . LYMPH NODE BIOPSY     in neck  . PORTA CATH REMOVAL      No current outpatient medications on file.   No current facility-administered medications for this visit.     ALLERGIES: Patient has no known allergies.  Family History  Problem Relation Age of Onset  . Pulmonary embolism Mother   . Atrial fibrillation Father   . Thyroid cancer Maternal Aunt   . Lung cancer Paternal Uncle   . Liver cancer Paternal Uncle     Social History   Socioeconomic History  . Marital status: Single    Spouse name: Not on file  . Number of children: Not on file  . Years of education: Not on file  . Highest education level: Not on file  Occupational History   . Not on file  Tobacco Use  . Smoking status: Never Smoker  . Smokeless tobacco: Never Used  Substance and Sexual Activity  . Alcohol use: Yes    Alcohol/week: 1.0 - 2.0 standard drinks    Types: 1 - 2 Standard drinks or equivalent per week  . Drug use: No  . Sexual activity: Yes    Partners: Male    Birth control/protection: None  Other Topics Concern  . Not on file  Social History Narrative  . Not on file   Social Determinants of Health   Financial Resource Strain:   . Difficulty of Paying Living Expenses:   Food Insecurity:   . Worried About Charity fundraiser in the Last Year:   . Arboriculturist in the Last Year:   Transportation Needs:   . Film/video editor (Medical):   Marland Kitchen Lack of Transportation (Non-Medical):   Physical Activity:   . Days of Exercise per Week:   . Minutes of Exercise per Session:   Stress:   . Feeling of Stress :  Social Connections:   . Frequency of Communication with Friends and Family:   . Frequency of Social Gatherings with Friends and Family:   . Attends Religious Services:   . Active Member of Clubs or Organizations:   . Attends Archivist Meetings:   Marland Kitchen Marital Status:   Intimate Partner Violence:   . Fear of Current or Ex-Partner:   . Emotionally Abused:   Marland Kitchen Physically Abused:   . Sexually Abused:     Review of Systems  Constitutional: Negative.   HENT: Negative.   Eyes: Negative.   Respiratory: Negative.   Cardiovascular: Negative.   Gastrointestinal: Negative.   Endocrine: Negative.   Genitourinary: Positive for menstrual problem.       Irregular periods  Musculoskeletal: Negative.   Skin: Negative.   Allergic/Immunologic: Negative.   Neurological: Negative.   Hematological: Negative.   Psychiatric/Behavioral: Negative.     PHYSICAL EXAMINATION:    BP 132/76 (BP Location: Right Arm, Patient Position: Sitting, Cuff Size: Normal)   Pulse 92   Temp (!) 97.5 F (36.4 C) (Skin)   Resp 14   Ht _0   (1.676 m)   Wt 187 lb 1.6 oz (84.9 kg)   LMP 07/24/2019   BMI 30.20 kg/m     General appearance: alert, cooperative and appears stated age   Pelvic: External genitalia:  no lesions              Urethra:  normal appearing urethra with no masses, tenderness or lesions              Bartholins and Skenes: normal                 Vagina: normal appearing vagina with normal color and discharge, no lesions              Cervix: no lesions                Bimanual Exam:  Uterus:  normal size, contour, position, consistency, mobility, non-tender              Adnexa: no mass, fullness, tenderness             Chaperone was present for exam.  ASSESSMENT  Irregular menses.  Probably anovulatory bleeding. Family history of  PE.  Mother was on COCs at the time.  PLAN  We discussed reasons for irregular menstruation - infection, anovulation, hormonal change, anatomic reasons. STD screening for GC/CT/trich.  TSH and prolactin.  Provera 10 mg x 10 days.  Declines contraceptive options.  If bleeding persists, pelvic US. Patient will get a copy of her blood work done after her mother had her PE.   An After Visit Summary was printed and given to the patient.  __25____ minutes face to face time of which over 50% was spent in counseling.

## 2019-08-12 LAB — PROLACTIN: Prolactin: 15.4 ng/mL (ref 4.8–23.3)

## 2019-08-12 LAB — TSH: TSH: 4.07 u[IU]/mL (ref 0.450–4.500)

## 2019-08-14 LAB — CERVICOVAGINAL ANCILLARY ONLY
Chlamydia: NEGATIVE
Comment: NEGATIVE
Comment: NEGATIVE
Comment: NORMAL
Neisseria Gonorrhea: NEGATIVE
Trichomonas: NEGATIVE

## 2019-08-18 ENCOUNTER — Telehealth: Payer: Self-pay | Admitting: *Deleted

## 2019-08-18 NOTE — Telephone Encounter (Signed)
Message left to return call to Triage Nurse at [phone].    

## 2019-08-18 NOTE — Telephone Encounter (Signed)
-----   Message from Nunzio Cobbs, MD sent at 08/18/2019 10:33 AM EDT ----- Please contact patient with results of testing. She tested negative for gonorrhea, chlamydia, and trichomonas.  Her TSH and prolactin levels are normal.   Please have her contact me if her irregular bleeding persists.

## 2019-08-18 NOTE — Telephone Encounter (Signed)
Patient returned call. Patient notified of results and verbalized understanding. Aware to return call to office if irregular bleeding persists.   Encounter closed.

## 2020-03-31 ENCOUNTER — Other Ambulatory Visit: Payer: Self-pay

## 2020-03-31 ENCOUNTER — Ambulatory Visit
Admission: EM | Admit: 2020-03-31 | Discharge: 2020-03-31 | Disposition: A | Discharge status: Home / Self Care | Payer: 59 | Attending: Emergency Medicine | Admitting: Emergency Medicine

## 2020-03-31 DIAGNOSIS — S61217A Laceration without foreign body of left little finger without damage to nail, initial encounter: Secondary | ICD-10-CM

## 2020-03-31 NOTE — ED Provider Notes (Signed)
[facility]    CSN: [id number] Arrival date & time: 03/31/20  1517      History   Chief Complaint Chief Complaint  Patient presents with  . finger laceration    Left pinky     HPI Alisha Schroeder is a 34 y.o. female history of Hodgkin's lymphoma, presenting today for evaluation of laceration.  Reports cut left hand with a pair of floral shears.  Sustained wound to her left pinky.  Reports occurred around noon today, has had persistent bleeding prompting her concern to be seen today.  Denies use of blood thinners.  Last tetanus this year.  Denies difficulty bending pinky.  HPI  Past Medical History:  Diagnosis Date  . Hodgkin lymphoma ([place])     There are no problems to display for this patient.   Past Surgical History:  Procedure Laterality Date  . BONE MARROW BIOPSY    . LYMPH NODE BIOPSY     in neck  . PORTA CATH REMOVAL      OB History    Gravida  0   Para  0   Term  0   Preterm  0   AB  0   Living  0     SAB  0   IAB  0   Ectopic  0   Multiple  0   Live Births  0            Home Medications    Prior to Admission medications   Medication Sig Start Date End Date Taking? Authorizing Provider  medroxyPROGESTERone (PROVERA) 10 MG tablet Take 1 tablet (10 mg total) by mouth daily. 08/11/19   Nunzio Cobbs, MD    Family History Family History  Problem Relation Age of Onset  . Pulmonary embolism Mother   . Atrial fibrillation Father   . Thyroid cancer Maternal Aunt   . Lung cancer Paternal Uncle   . Liver cancer Paternal Uncle     Social History Social History   Tobacco Use  . Smoking status: Never Smoker  . Smokeless tobacco: Never Used  Vaping Use  . Vaping Use: Never used  Substance Use Topics  . Alcohol use: Yes    Alcohol/week: 1.0 - 2.0 standard drink    Types: 1 - 2 Standard drinks or equivalent per week  . Drug use: No     Allergies   Patient has no known allergies.   Review of  Systems Review of Systems  Constitutional: Negative for fatigue and fever.  Eyes: Negative for visual disturbance.  Respiratory: Negative for shortness of breath.   Cardiovascular: Negative for chest pain.  Gastrointestinal: Negative for abdominal pain, nausea and vomiting.  Musculoskeletal: Negative for arthralgias and joint swelling.  Skin: Positive for wound. Negative for color change and rash.  Neurological: Negative for dizziness, weakness, light-headedness and headaches.     Physical Exam Triage Vital Signs ED Triage Vitals  Enc Vitals Group     BP      Pulse      Resp      Temp      Temp src      SpO2      Weight      Height      Head Circumference      Peak Flow      Pain Score      Pain Loc      Pain Edu?      Excl. in [place]?  No data found.  Updated Vital Signs BP 109/75 (BP Location: Right Arm)   Pulse 84   Temp (!) 97.3 F (36.3 C) (Oral)   Resp 16   LMP 03/24/2020   SpO2 99%   Visual Acuity Right Eye Distance:   Left Eye Distance:   Bilateral Distance:    Right Eye Near:   Left Eye Near:    Bilateral Near:     Physical Exam Vitals and nursing note reviewed.  Constitutional:      Appearance: She is well-developed and well-nourished.     Comments: No acute distress  HENT:     Head: Normocephalic and atraumatic.     Nose: Nose normal.  Eyes:     Conjunctiva/sclera: Conjunctivae normal.  Cardiovascular:     Rate and Rhythm: Normal rate.  Pulmonary:     Effort: Pulmonary effort is normal. No respiratory distress.  Abdominal:     General: There is no distension.  Musculoskeletal:        General: Normal range of motion.     Cervical back: Neck supple.     Comments: Full active range of motion of left pinky at DIP and PIP  Skin:    General: Skin is warm and dry.     Comments: Distal phalanx on the palmar side with approximately 1 cm linear laceration exposing subcutaneous tissue, wound edges well reapproximated without much pressure,  bleeding minimal at time of visit  Neurological:     Mental Status: She is alert and oriented to person, place, and time.  Psychiatric:        Mood and Affect: Mood and affect normal.      UC Treatments / Results  Labs (all labs ordered are listed, but only abnormal results are displayed) Labs Reviewed - No data to display  EKG   Radiology No results found.  Procedures Laceration Repair  Date/Time: 03/31/2020 3:59 PM Performed by: Deane Melick, [place] C, PA-C Authorized by: Elmo Rio, [place] C, PA-C   Consent:    Consent obtained:  Verbal   Consent given by:  Patient   Risks discussed:  Pain, infection and poor cosmetic result   Alternatives discussed:  No treatment Universal protocol:    Patient identity confirmed:  Verbally with patient Anesthesia:    Anesthesia method:  None Laceration details:    Location:  Finger   Finger location:  L small finger   Length (cm):  1 Exploration:    Hemostasis achieved with:  Direct pressure   Imaging outcome: foreign body not noted     Wound exploration: wound explored through full range of motion     Wound extent: no foreign bodies/material noted, no tendon damage noted and no underlying fracture noted     Contaminated: no   Treatment:    Area cleansed with:  Povidone-iodine   Amount of cleaning:  Standard   Irrigation solution:  Sterile water   Irrigation volume:  250   Debridement:  None Skin repair:    Repair method:  Tissue adhesive Approximation:    Approximation:  Close Repair type:    Repair type:  Simple Post-procedure details:    Dressing:  Non-adherent dressing   Procedure completion:  Tolerated well, no immediate complications   (including critical care time)  Medications Ordered in UC Medications - No data to display  Initial Impression / Assessment and Plan / UC Course  I have reviewed the triage vital signs and the nursing notes.  Pertinent labs & imaging results that  were available during my care of  the patient were reviewed by me and considered in my medical decision making (see chart for details).     Laceration repaired with Dermabond.  Discussed wound care.  Would expect gradual healing without complication.  Low suspicion of underlying tendon injury or fracture.  Tetanus up-to-date.  Monitor for any signs of infection,Discussed strict return precautions. Patient verbalized understanding and is agreeable with plan.  Final Clinical Impressions(s) / UC Diagnoses   Final diagnoses:  Laceration of left little finger without foreign body without damage to nail, initial encounter     Discharge Instructions     Keep clean and dry Avoid bending for the next 24 to 48 hours Protect wound over the next week as it is healing well at work Follow-up for any concerns for infection.    ED Prescriptions    None     PDMP not reviewed this encounter.   Jawan Chavarria, [place] C, PA-C 04/01/20 1101

## 2020-03-31 NOTE — ED Triage Notes (Signed)
Pt reports left pinky laceration that occurred at noon today with floral sheers. Applied neosporin and wrapped with bandaid. Pt reports being up to date on Tdap.

## 2020-03-31 NOTE — Discharge Instructions (Signed)
Keep clean and dry Avoid bending for the next 24 to 48 hours Protect wound over the next week as it is healing well at work Follow-up for any concerns for infection.

## 2020-04-20 ENCOUNTER — Other Ambulatory Visit: Payer: Self-pay

## 2020-04-20 ENCOUNTER — Ambulatory Visit
Admission: EM | Admit: 2020-04-20 | Discharge: 2020-04-20 | Disposition: A | Discharge status: Home / Self Care | Payer: 59 | Attending: Emergency Medicine | Admitting: Emergency Medicine

## 2020-04-20 DIAGNOSIS — J029 Acute pharyngitis, unspecified: Secondary | ICD-10-CM | POA: Diagnosis not present

## 2020-04-20 DIAGNOSIS — J069 Acute upper respiratory infection, unspecified: Secondary | ICD-10-CM

## 2020-04-20 LAB — POCT RAPID STREP A (OFFICE): Rapid Strep A Screen: NEGATIVE

## 2020-04-20 NOTE — ED Triage Notes (Signed)
Pt is here with a cough and sore throat that started Wednesday, pt has taken OTC meds to relieve discomfort.

## 2020-04-20 NOTE — ED Provider Notes (Signed)
[facility]    CSN: [id number] Arrival date & time: 04/20/20  0943      History   Chief Complaint Chief Complaint  Patient presents with  . Cough  . Sore Throat    HPI Riley Papin is a 35 y.o. female  History was provided by the patient. Anadalay Macdonell is a 35 y.o. female who presents for evaluation of a sore throat. Associated symptoms include dry cough and sore throat. Onset of symptoms was a few days ago, unchanged since that time.  She is drinking plenty of fluids. She has not had recent close exposure to someone with proven streptococcal pharyngitis. The following portions of the patient's history were reviewed and updated as appropriate: allergies, current medications, past family history, past medical history, past social history, past surgical history and problem list.     Past Medical History:  Diagnosis Date  . Hodgkin lymphoma ([place])     There are no problems to display for this patient.   Past Surgical History:  Procedure Laterality Date  . BONE MARROW BIOPSY    . LYMPH NODE BIOPSY     in neck  . PORTA CATH REMOVAL      OB History    Gravida  0   Para  0   Term  0   Preterm  0   AB  0   Living  0     SAB  0   IAB  0   Ectopic  0   Multiple  0   Live Births  0            Home Medications    Prior to Admission medications   Medication Sig Start Date End Date Taking? Authorizing Provider  medroxyPROGESTERone (PROVERA) 10 MG tablet Take 1 tablet (10 mg total) by mouth daily. 08/11/19   Nunzio Cobbs, MD    Family History Family History  Problem Relation Age of Onset  . Pulmonary embolism Mother   . Atrial fibrillation Father   . Thyroid cancer Maternal Aunt   . Lung cancer Paternal Uncle   . Liver cancer Paternal Uncle     Social History Social History   Tobacco Use  . Smoking status: Never Smoker  . Smokeless tobacco: Never Used  Vaping Use  . Vaping Use: Never used  Substance Use  Topics  . Alcohol use: Yes    Alcohol/week: 1.0 - 2.0 standard drink    Types: 1 - 2 Standard drinks or equivalent per week  . Drug use: No     Allergies   Patient has no known allergies.   Review of Systems Review of Systems  Constitutional: Negative for fatigue and fever.  HENT: Positive for sore throat. Negative for congestion, dental problem, ear pain, facial swelling, hearing loss, sinus pain, trouble swallowing and voice change.   Eyes: Negative for photophobia, pain and visual disturbance.  Respiratory: Positive for cough. Negative for shortness of breath.   Cardiovascular: Negative for chest pain and palpitations.  Gastrointestinal: Negative for diarrhea and vomiting.  Musculoskeletal: Negative for arthralgias and myalgias.  Neurological: Negative for dizziness and headaches.     Physical Exam Triage Vital Signs ED Triage Vitals  Enc Vitals Group     BP 04/20/20 1019 119/80     Pulse Rate 04/20/20 1019 95     Resp 04/20/20 1019 18     Temp 04/20/20 1019 98 F (36.7 C)     Temp Source 04/20/20 1019 Oral  SpO2 04/20/20 1019 98 %     Weight --      Height --      Head Circumference --      Peak Flow --      Pain Score 04/20/20 1018 0     Pain Loc --      Pain Edu? --      Excl. in [place]? --    No data found.  Updated Vital Signs BP 119/80 (BP Location: Left Arm)   Pulse 95   Temp 98 F (36.7 C) (Oral)   Resp 18   LMP 03/24/2020   SpO2 98%   Visual Acuity Right Eye Distance:   Left Eye Distance:   Bilateral Distance:    Right Eye Near:   Left Eye Near:    Bilateral Near:     Physical Exam Constitutional:      General: She is not in acute distress.    Appearance: She is not ill-appearing or diaphoretic.  HENT:     Head: Normocephalic and atraumatic.     Right Ear: Tympanic membrane and ear canal normal.     Left Ear: Tympanic membrane and ear canal normal.     Mouth/Throat:     Mouth: Mucous membranes are moist.     Pharynx: Oropharynx is  clear. Uvula midline. No oropharyngeal exudate, posterior oropharyngeal erythema or uvula swelling.     Tonsils: No tonsillar exudate.  Eyes:     General: No scleral icterus.    Conjunctiva/sclera: Conjunctivae normal.     Pupils: Pupils are equal, round, and reactive to light.  Neck:     Comments: Trachea midline, negative JVD Cardiovascular:     Rate and Rhythm: Normal rate and regular rhythm.     Heart sounds: No murmur heard. No gallop.   Pulmonary:     Effort: Pulmonary effort is normal. No respiratory distress.     Breath sounds: No wheezing, rhonchi or rales.  Musculoskeletal:     Cervical back: Neck supple. No tenderness.  Lymphadenopathy:     Cervical: No cervical adenopathy.  Skin:    Capillary Refill: Capillary refill takes less than 2 seconds.     Coloration: Skin is not jaundiced or pale.     Findings: No rash.  Neurological:     General: No focal deficit present.     Mental Status: She is alert and oriented to person, place, and time.      UC Treatments / Results  Labs (all labs ordered are listed, but only abnormal results are displayed) Labs Reviewed  CULTURE, GROUP A STREP ([place])  NOVEL CORONAVIRUS, NAA  POCT RAPID STREP A (OFFICE)    EKG   Radiology No results found.  Procedures Procedures (including critical care time)  Medications Ordered in UC Medications - No data to display  Initial Impression / Assessment and Plan / UC Course  I have reviewed the triage vital signs and the nursing notes.  Pertinent labs & imaging results that were available during my care of the patient were reviewed by me and considered in my medical decision making (see chart for details).     Patient afebrile, nontoxic, with SpO2 98%.  Covid PCR pending.  Patient to quarantine until results are back.  We will treat supportively as outlined below.  Return precautions discussed, patient verbalized understanding and is agreeable to plan. Final Clinical Impressions(s)  / UC Diagnoses   Final diagnoses:  Viral URI with cough  Sore throat   Discharge  Instructions   None    ED Prescriptions    None     PDMP not reviewed this encounter.   Hall[place]Potvin, [place], [place] 04/20/20 1101

## 2020-04-23 LAB — NOVEL CORONAVIRUS, NAA: SARS-CoV-2, NAA: NOT DETECTED

## 2020-04-24 LAB — CULTURE, GROUP A STREP (THRC): Special Requests: NORMAL

## 2020-05-27 ENCOUNTER — Other Ambulatory Visit: Payer: Self-pay

## 2020-05-27 ENCOUNTER — Ambulatory Visit (INDEPENDENT_AMBULATORY_CARE_PROVIDER_SITE_OTHER): Payer: 59

## 2020-05-27 ENCOUNTER — Encounter: Payer: Self-pay | Admitting: Emergency Medicine

## 2020-05-27 ENCOUNTER — Ambulatory Visit
Admission: EM | Admit: 2020-05-27 | Discharge: 2020-05-27 | Disposition: A | Discharge status: Home / Self Care | Payer: 59 | Attending: Family Medicine | Admitting: Family Medicine

## 2020-05-27 DIAGNOSIS — M25562 Pain in left knee: Secondary | ICD-10-CM

## 2020-05-27 DIAGNOSIS — M25572 Pain in left ankle and joints of left foot: Secondary | ICD-10-CM | POA: Diagnosis not present

## 2020-05-27 DIAGNOSIS — S99912A Unspecified injury of left ankle, initial encounter: Secondary | ICD-10-CM

## 2020-05-27 DIAGNOSIS — S8992XA Unspecified injury of left lower leg, initial encounter: Secondary | ICD-10-CM | POA: Diagnosis not present

## 2020-05-27 MED ORDER — CYCLOBENZAPRINE HCL 10 MG PO TABS: 10.0000 mg | ORAL_TABLET | Freq: Two times a day (BID) | ORAL | 0 refills | Status: AC | PRN

## 2020-05-27 MED ORDER — IBUPROFEN 600 MG PO TABS: 600.0000 mg | ORAL_TABLET | Freq: Four times a day (QID) | ORAL | 0 refills | Status: AC | PRN

## 2020-05-27 NOTE — ED Triage Notes (Signed)
Pt here for left ankle and left knee pain after slip and fall on ice today

## 2020-05-27 NOTE — Discharge Instructions (Signed)
Keep knee wrapped with Ace wrap with any ambulating activities.  While at home I would recommend elevating and applying ice intermittently if any swelling develops.  To reduce inflammation and help with pain recommend ibuprofen 600 mg 3 times daily for the next 3 days initially and then as needed after that.  For any acute pain I have prescribed cyclobenzaprine 10 mg you may take up to twice daily however medication does cause severe drowsiness to take precautions while taking medication.

## 2020-05-27 NOTE — ED Provider Notes (Signed)
[facility]    CSN: [id number] Arrival date & time: 05/27/20  1023      History   Chief Complaint Chief Complaint  Patient presents with  . Ankle Pain  . Knee Pain    HPI Alisha Schroeder is a 35 y.o. female.   HPI Patient presents today for evaluation of a acute injury involving the left ankle and left knee following a slip and fall injury due to icy surfaces earlier today.  She reports the injury involving her knee is more significant than the left ankle as she is able to bear weight.  She has some medial lateral tenderness of the left knee as she slipped landing on brick stairs with most of the impact to her knee.  Denies any prior knee injuries.  She has taken Tylenol for pain today.  She reports some mild ankle pain however no diffuse swelling and has full range of motion.  Past Medical History:  Diagnosis Date  . Hodgkin lymphoma ([place])     There are no problems to display for this patient.   Past Surgical History:  Procedure Laterality Date  . BONE MARROW BIOPSY    . LYMPH NODE BIOPSY     in neck  . PORTA CATH REMOVAL      OB History    Gravida  0   Para  0   Term  0   Preterm  0   AB  0   Living  0     SAB  0   IAB  0   Ectopic  0   Multiple  0   Live Births  0            Home Medications    Prior to Admission medications   Medication Sig Start Date End Date Taking? Authorizing Provider  medroxyPROGESTERone (PROVERA) 10 MG tablet Take 1 tablet (10 mg total) by mouth daily. 08/11/19   Nunzio Cobbs, MD    Family History Family History  Problem Relation Age of Onset  . Pulmonary embolism Mother   . Atrial fibrillation Father   . Thyroid cancer Maternal Aunt   . Lung cancer Paternal Uncle   . Liver cancer Paternal Uncle     Social History Social History   Tobacco Use  . Smoking status: Never Smoker  . Smokeless tobacco: Never Used  Vaping Use  . Vaping Use: Never used  Substance Use Topics  .  Alcohol use: Yes    Alcohol/week: 1.0 - 2.0 standard drink    Types: 1 - 2 Standard drinks or equivalent per week  . Drug use: No     Allergies   Patient has no known allergies.   Review of Systems Review of Systems Pertinent negatives listed in HPI  Physical Exam Triage Vital Signs ED Triage Vitals [05/27/20 1120]  Enc Vitals Group     BP 122/82     Pulse Rate 88     Resp 18     Temp 98.2 F (36.8 C)     Temp Source Oral     SpO2 99 %     Weight      Height      Head Circumference      Peak Flow      Pain Score 6     Pain Loc      Pain Edu?      Excl. in [place]?    No data found.  Updated Vital Signs BP  122/82 (BP Location: Left Arm)   Pulse 88   Temp 98.2 F (36.8 C) (Oral)   Resp 18   SpO2 99%   Visual Acuity Right Eye Distance:   Left Eye Distance:   Bilateral Distance:    Right Eye Near:   Left Eye Near:    Bilateral Near:     Physical Exam Constitutional:      Appearance: Normal appearance.  HENT:     Nose: Nose normal.  Eyes:     Extraocular Movements: Extraocular movements intact.     Pupils: Pupils are equal, round, and reactive to light.  Cardiovascular:     Rate and Rhythm: Normal rate and regular rhythm.  Pulmonary:     Effort: Pulmonary effort is normal.     Breath sounds: Normal breath sounds.  Musculoskeletal:     Left knee: Tenderness present over the MCL.     Left ankle: Swelling and ecchymosis present. Tenderness present. Decreased range of motion.     Comments: Pain with flexion extension  Skin:    General: Skin is warm.     Capillary Refill: Capillary refill takes less than 2 seconds.  Neurological:     General: No focal deficit present.     Mental Status: She is alert.  Psychiatric:        Mood and Affect: Mood normal.        Behavior: Behavior normal.        Thought Content: Thought content normal.        Judgment: Judgment normal.      UC Treatments / Results  Labs (all labs ordered are listed, but only abnormal  results are displayed) Labs Reviewed - No data to display  EKG   Radiology DG Ankle Complete Left  Result Date: 05/27/2020 CLINICAL DATA:  Pain following fall EXAM: LEFT ANKLE COMPLETE - 3+ VIEW COMPARISON:  None. FINDINGS: Frontal, oblique, and lateral views were obtained. There is no appreciable fracture or joint effusion. No joint space narrowing or erosion. Ankle mortise appears intact. IMPRESSION: No evident fracture or appreciable arthropathy. Ankle mortise appears intact. Electronically Signed   By: Lowella Grip III M.D.   On: 05/27/2020 11:58   DG Knee Complete 4 Views Left  Result Date: 05/27/2020 CLINICAL DATA:  Pain following fall EXAM: LEFT KNEE - COMPLETE 4+ VIEW COMPARISON:  None. FINDINGS: Frontal, lateral, and bilateral oblique views were obtained. No fracture or dislocation. No joint effusion. Joint spaces appear normal. No erosive changes. IMPRESSION: No fracture, dislocation, or joint effusion. No evident arthropathy. Electronically Signed   By: Lowella Grip III M.D.   On: 05/27/2020 11:59    Procedures Procedures (including critical care time)  Medications Ordered in UC Medications - No data to display  Initial Impression / Assessment and Plan / UC Course  I have reviewed the triage vital signs and the nursing notes.  Pertinent labs & imaging results that were available during my care of the patient were reviewed by me and considered in my medical decision making (see chart for details).     Acute left ankle and left knee strain related to fall today.  Imaging negative for any acute fractures involving the ankle or left knee. Recommend high-dose NSAIDs, ice, and cyclobenzaprine prescribed as needed for pain.  Patient given precautions to follow-up for additional work-up if knee pain has not improved within the next 3 to 4 days as injury could be related to a ligament given the location of bruising and pain  at the [facility].  Patient verbalized understanding and  agreement with plan. Final Clinical Impressions(s) / UC Diagnoses   Final diagnoses:  Left ankle injury, initial encounter  Injury of left knee, initial encounter     Discharge Instructions     Keep knee wrapped with Ace wrap with any ambulating activities.  While at home I would recommend elevating and applying ice intermittently if any swelling develops.  To reduce inflammation and help with pain recommend ibuprofen 600 mg 3 times daily for the next 3 days initially and then as needed after that.  For any acute pain I have prescribed cyclobenzaprine 10 mg you may take up to twice daily however medication does cause severe drowsiness to take precautions while taking medication.    ED Prescriptions    Medication Sig Dispense Auth. Provider   ibuprofen (ADVIL) 600 MG tablet Take 1 tablet (600 mg total) by mouth every 6 (six) hours as needed. 30 tablet Scot Jun, FNP   cyclobenzaprine (FLEXERIL) 10 MG tablet Take 1 tablet (10 mg total) by mouth 2 (two) times daily as needed for muscle spasms. 20 tablet Scot Jun, FNP     PDMP not reviewed this encounter.   Scot Jun, FNP 05/30/20 1728

## 2021-09-09 ENCOUNTER — Telehealth: Payer: Self-pay | Admitting: Hematology and Oncology

## 2021-09-09 NOTE — Telephone Encounter (Signed)
Scheduled appt per 5/19 referral. Pt is aware of appt date and time. Pt is aware to arrive 15 mins prior to appt time and to bring and updated insurance card. Pt is aware of appt location.   

## 2021-10-02 ENCOUNTER — Other Ambulatory Visit: Payer: Self-pay

## 2021-10-02 ENCOUNTER — Ambulatory Visit: Payer: Self-pay | Admitting: Hematology and Oncology

## 2021-12-26 ENCOUNTER — Encounter (HOSPITAL_COMMUNITY): Payer: Self-pay | Admitting: Emergency Medicine

## 2021-12-26 ENCOUNTER — Other Ambulatory Visit: Payer: Self-pay

## 2021-12-26 ENCOUNTER — Emergency Department (HOSPITAL_COMMUNITY)
Admission: EM | Admit: 2021-12-26 | Discharge: 2021-12-26 | Discharge status: Against Medical Advice | Payer: No Typology Code available for payment source | Attending: Emergency Medicine | Admitting: Emergency Medicine

## 2021-12-26 DIAGNOSIS — H538 Other visual disturbances: Secondary | ICD-10-CM | POA: Insufficient documentation

## 2021-12-26 DIAGNOSIS — R1013 Epigastric pain: Secondary | ICD-10-CM | POA: Diagnosis present

## 2021-12-26 DIAGNOSIS — Z5321 Procedure and treatment not carried out due to patient leaving prior to being seen by health care provider: Secondary | ICD-10-CM | POA: Diagnosis not present

## 2021-12-26 LAB — COMPREHENSIVE METABOLIC PANEL
ALT: 20 U/L (ref 0–44)
AST: 25 U/L (ref 15–41)
Albumin: 4.4 g/dL (ref 3.5–5.0)
Alkaline Phosphatase: 57 U/L (ref 38–126)
Anion gap: 9 (ref 5–15)
BUN: 10 mg/dL (ref 6–20)
CO2: 25 mmol/L (ref 22–32)
Calcium: 9.1 mg/dL (ref 8.9–10.3)
Chloride: 104 mmol/L (ref 98–111)
Creatinine, Ser: 0.84 mg/dL (ref 0.44–1.00)
GFR, Estimated: >60 mL/min (ref >60)
Glucose, Bld: 103 mg/dL — ABNORMAL HIGH (ref 70–99)
Potassium: 3.9 mmol/L (ref 3.5–5.1)
Sodium: 138 mmol/L (ref 135–145)
Total Bilirubin: 1.7 mg/dL — ABNORMAL HIGH (ref 0.3–1.2)
Total Protein: 7.3 g/dL (ref 6.5–8.1)

## 2021-12-26 LAB — URINALYSIS, ROUTINE W REFLEX MICROSCOPIC
Bilirubin Urine: NEGATIVE
Glucose, UA: NEGATIVE mg/dL
Ketones, ur: NEGATIVE mg/dL
Leukocytes,Ua: NEGATIVE
Nitrite: NEGATIVE
Protein, ur: NEGATIVE mg/dL
Specific Gravity, Urine: 1.005 (ref 1.005–1.030)
pH: 6 (ref 5.0–8.0)

## 2021-12-26 LAB — CBC
HCT: 39.3 % (ref 36.0–46.0)
Hemoglobin: 13.7 g/dL (ref 12.0–15.0)
MCH: 31.4 pg (ref 26.0–34.0)
MCHC: 34.9 g/dL (ref 30.0–36.0)
MCV: 90.1 fL (ref 80.0–100.0)
Platelets: 224 10*3/uL (ref 150–400)
RBC: 4.36 MIL/uL (ref 3.87–5.11)
RDW: 12 % (ref 11.5–15.5)
WBC: 5.6 10*3/uL (ref 4.0–10.5)
nRBC: 0 % (ref 0.0–0.2)

## 2021-12-26 LAB — I-STAT BETA HCG BLOOD, ED (MC, WL, AP ONLY): I-stat hCG, quantitative: <5 m[IU]/mL (ref <5)

## 2021-12-26 LAB — LIPASE, BLOOD: Lipase: 37 U/L (ref 11–51)

## 2021-12-26 NOTE — ED Notes (Signed)
Called pt x2 for vitals, no response. °

## 2021-12-26 NOTE — ED Triage Notes (Signed)
Patient here with compliant of abdominal pain that started 0500 this morning and blurred vision that started at approximately 1530 this afternoon. Patient states both symptoms are improving but not resolved. Denies vomiting, nausea, and diarrhea. PAtient is alert, oriented, ambulatory, and in no apparent distress at this time.

## 2021-12-26 NOTE — ED Provider Triage Note (Signed)
Emergency Medicine Provider Triage Evaluation Note  Alisha Schroeder , a 36 y.o. female  was evaluated in triage.  Pt complains of epigastric abdominal pain and blurred vision.  Patient states that she had epigastric abdominal pain upon awakening this morning described as tightness/pressure.  She then went to work around 330 this afternoon and experienced blurry vision in both eyes prompting her visit to the emergency department.  She is currently complaining of no symptoms at this time.  Denies chest pain, shortness of breath, nausea, vomiting, urinary/vaginal symptoms, change in bowel habits.  She states that he has no history of symptoms similar.  Denies fever, chills, night sweats..  Review of Systems  Positive: See above Negative:   Physical Exam  BP (!) 147/91 (BP Location: Right Arm)   Pulse 81   Temp 98.7 F (37.1 C) (Oral)   Resp 18   SpO2 100%  Gen:   Awake, no distress   Resp:  Normal effort  MSK:   Moves extremities without difficulty  Other:  Cranial nerve III through XII grossly intact.  No abdominal tenderness on exam.  Lungs clear to auscultation.  Regular rate and rhythm.  No obvious murmur gallop or rub.  Medical Decision Making  Medically screening exam initiated at 4:35 PM.  Appropriate orders placed.  [facility] was informed that the remainder of the evaluation will be completed by another provider, this initial triage assessment does not replace that evaluation, and the importance of remaining in the ED until their evaluation is complete.     Wilnette Kales, [place] 12/26/21 [phone]

## 2021-12-26 NOTE — ED Notes (Signed)
X3 no response

## 2022-09-19 IMAGING — DX DG ANKLE COMPLETE 3+V*L*
3 series · 3 of 3 positions shown · non-contrast
Comparison: None.

CLINICAL DATA: Pain following fall

EXAM:
LEFT ANKLE COMPLETE - 3+ VIEW

[ankle ap]
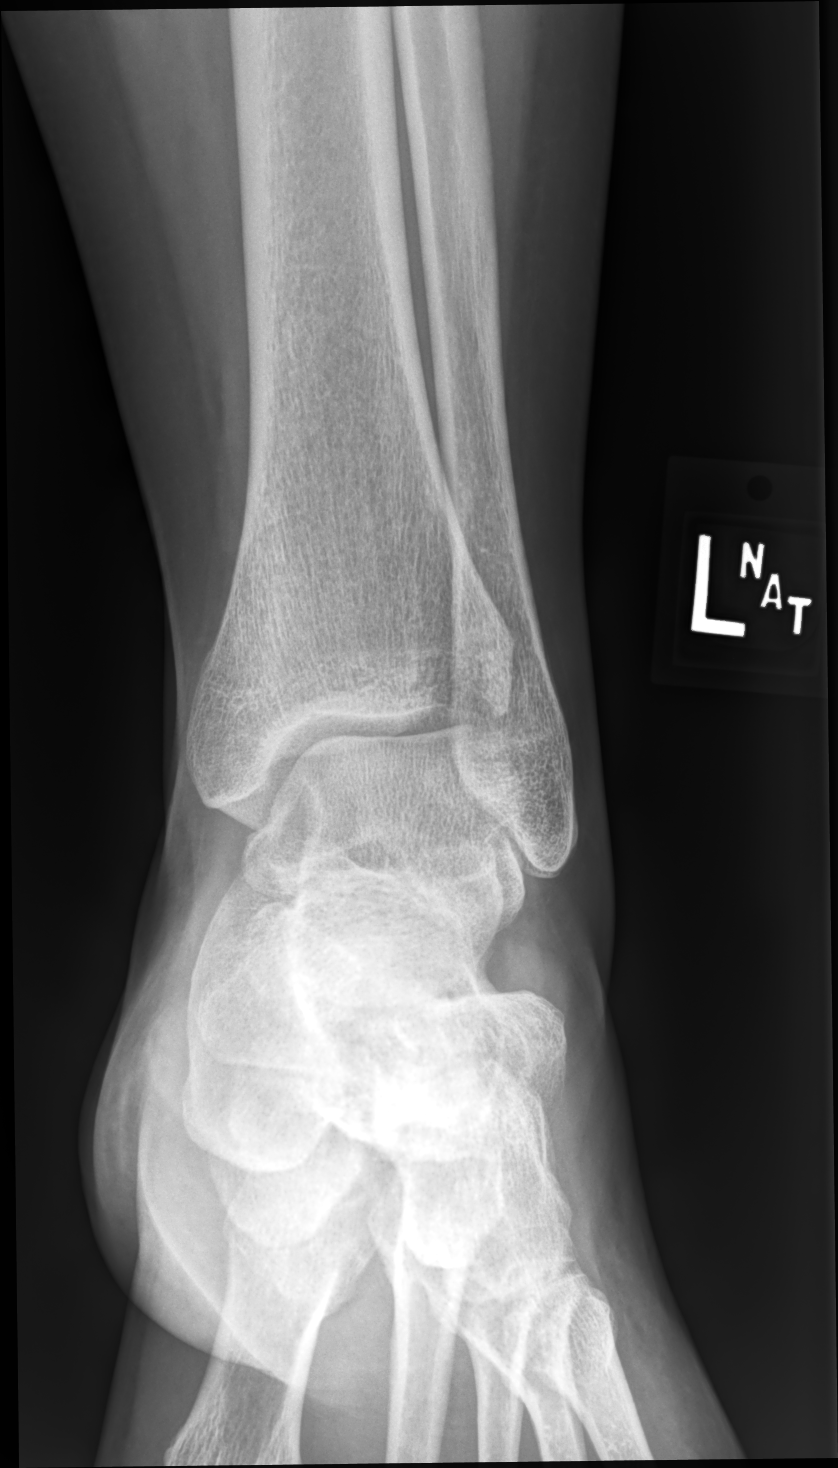

[ankle medial oblique]
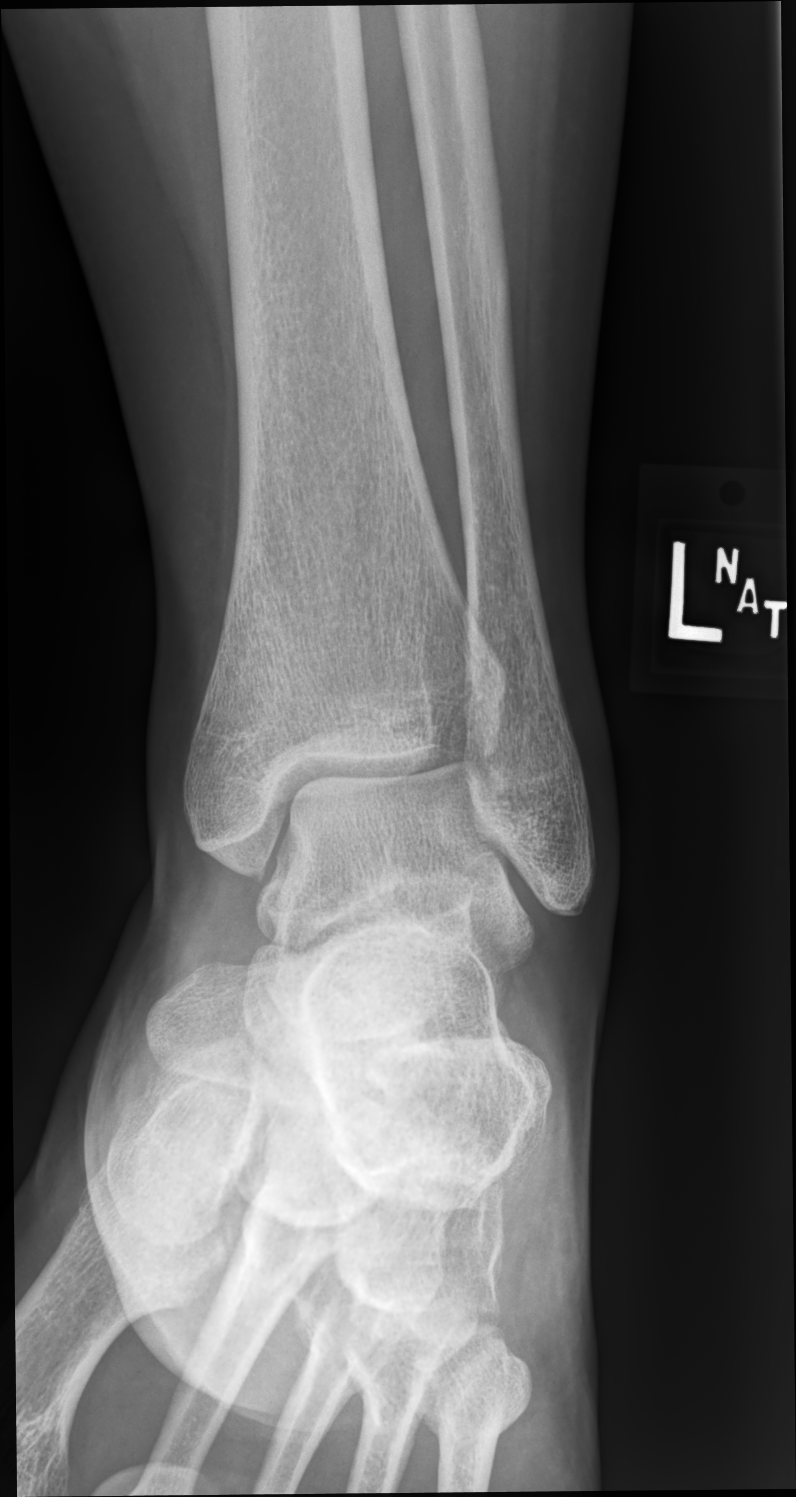

[ankle lat]
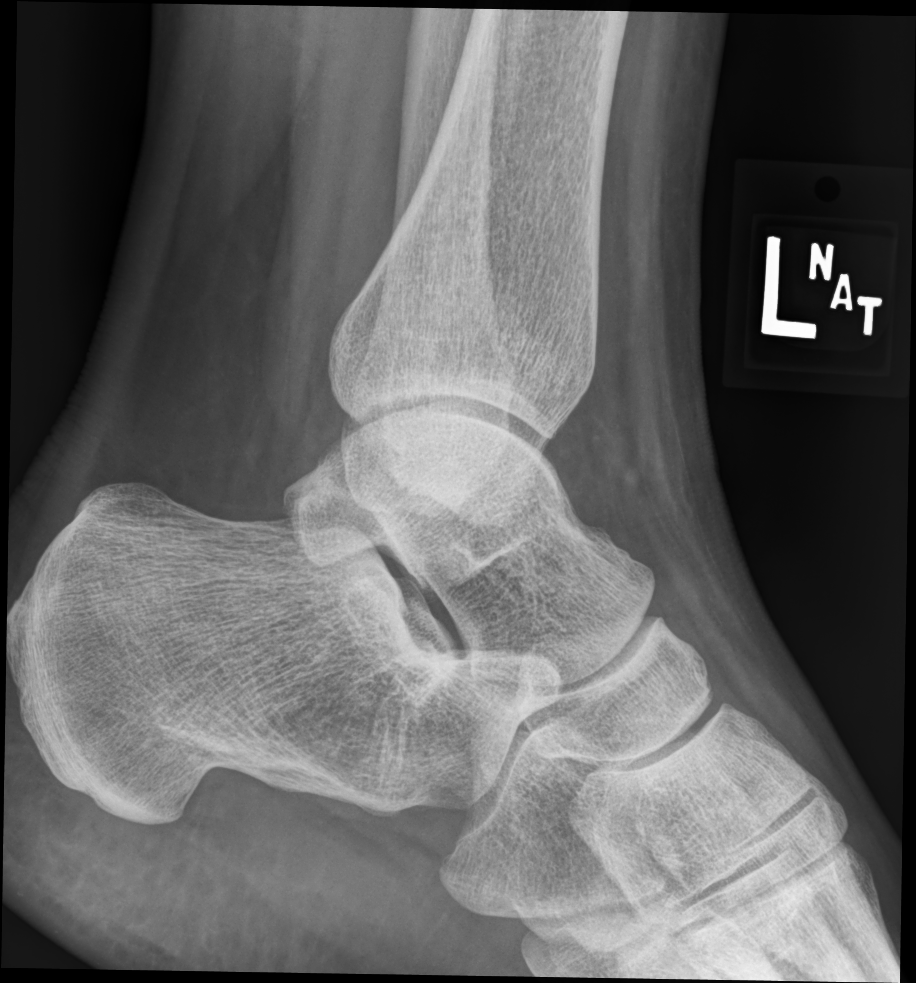

[3 of 3 positions shown; findings below may reference images not displayed]

FINDINGS: Frontal, oblique, and lateral views were obtained. There is no
appreciable fracture or joint effusion. No joint space narrowing or
erosion. Ankle mortise appears intact.
IMPRESSION: No evident fracture or appreciable arthropathy. Ankle mortise
appears intact.
